# Patient Record
Sex: Male | Born: 1980 | Race: White | Hispanic: No | Marital: Married | Smoking: Current some day smoker
Health system: Southern US, Community
[De-identification: ages and names within clinical notes are randomized; demographics above are authoritative.]

## PROBLEM LIST (undated history)

## (undated) DIAGNOSIS — M1990 Unspecified osteoarthritis, unspecified site: Secondary | ICD-10-CM

## (undated) HISTORY — PX: ROTATOR CUFF REPAIR: SHX139

## (undated) HISTORY — PX: WRIST ARTHROSCOPY: SHX838

---

## 2002-06-21 ENCOUNTER — Encounter: Payer: Self-pay | Admitting: Family Medicine

## 2002-06-21 ENCOUNTER — Ambulatory Visit (HOSPITAL_COMMUNITY): Admission: RE | Admit: 2002-06-21 | Discharge: 2002-06-21 | Payer: Self-pay | Admitting: Family Medicine

## 2003-07-18 ENCOUNTER — Ambulatory Visit (HOSPITAL_COMMUNITY): Admission: RE | Admit: 2003-07-18 | Discharge: 2003-07-19 | Payer: Self-pay | Admitting: Orthopedic Surgery

## 2003-10-07 ENCOUNTER — Ambulatory Visit (HOSPITAL_COMMUNITY): Admission: RE | Admit: 2003-10-07 | Discharge: 2003-10-07 | Payer: Self-pay | Admitting: Orthopedic Surgery

## 2005-11-22 IMAGING — CT CT EXTREM UP W/O CM*L*
3 series · 16 of 34 positions shown, 19 images · non-contrast
Comparison: none

CLINICAL DATA: Follow-up scaphoid fracture; status post bone graft; recent removal of surgical pins
CT OF THE LEFT WRIST WITHOUT CONTRAST
Multidetector CT imaging of the left wrist was performed in the direct axial plane.  Sagittal and coronal plane reconstructed images were reformatted from the axial CT data set and were also reviewed.  
Fracture deformity of the scaphoid bone is seen which appears old.  There is no evidence of acute fracture on this study and there is no evidence of significant osteosclerosis or abnormal bone lucency which would be worrisome for avascular necrosis.  There is no evidence of significant degenerative changes.  Alignment to the bones is normal. 
No other fractures or bone lesions are seen. 
IMPRESSION
Fracture deformity of the scaphoid bone, which appears well healed.  No evidence of acute fracture or findings suggestive of avascular necrosis.  
CT MULTIPLANAR RECONSTRUCTION OF THE LEFT WRIST
Multiplanar reformatted CT images were reconstructed from the axial CT data set. These images were reviewed and pertinent findings are included in the accompanying complete CT report. 

See complete CT report above.

[Series 3: recon 2: upper extremity · axial · 0.31mm/px · z∈[+87,+209]mm · 8 of 59 slices shown, 10 images]
[im 5/59  soft-tissue]
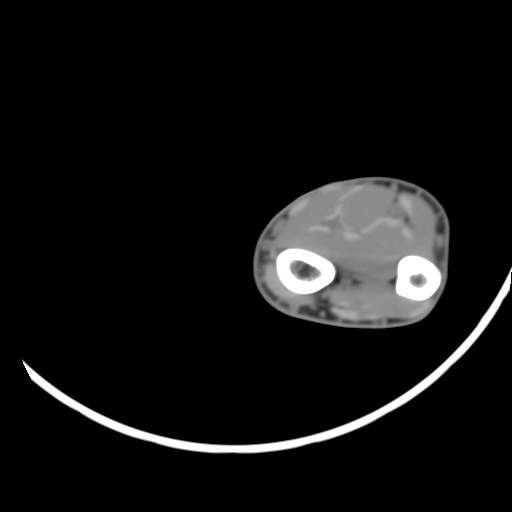
[im 5/59  bone]
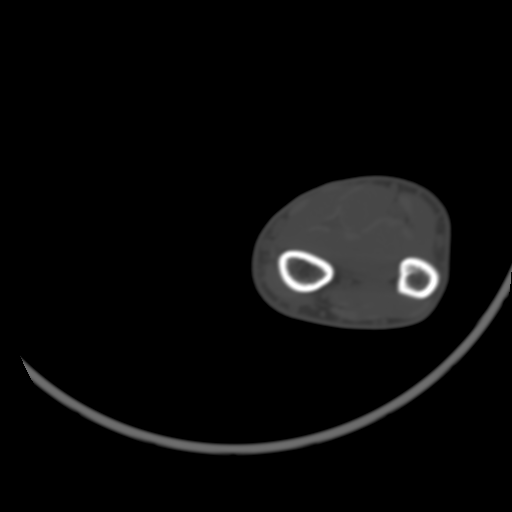
[im 14/59  bone]
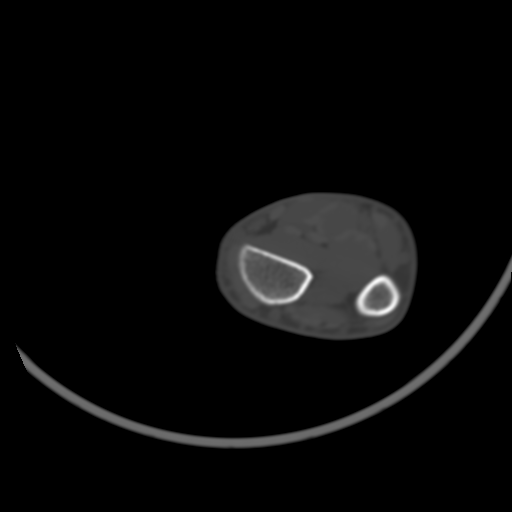
[im 18/59  bone]
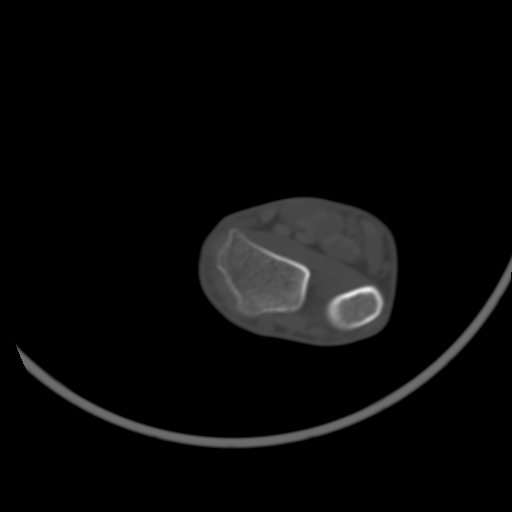
[im 27/59  bone]
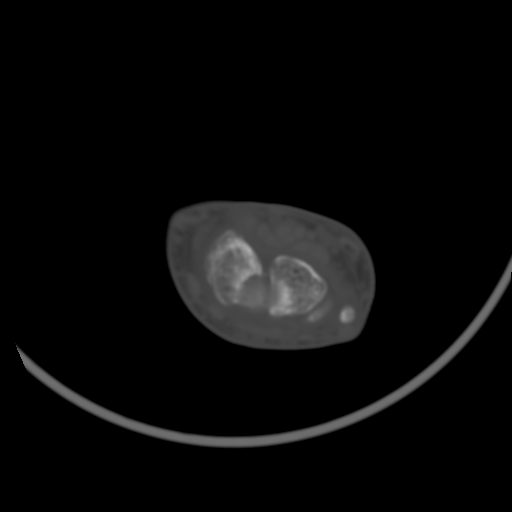
[im 32/59  soft-tissue]
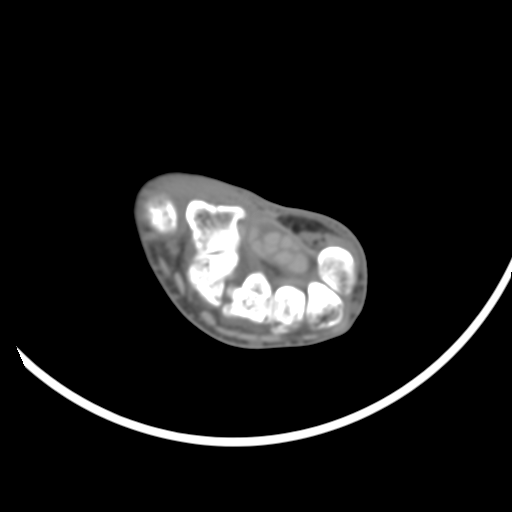
[im 32/59  bone]
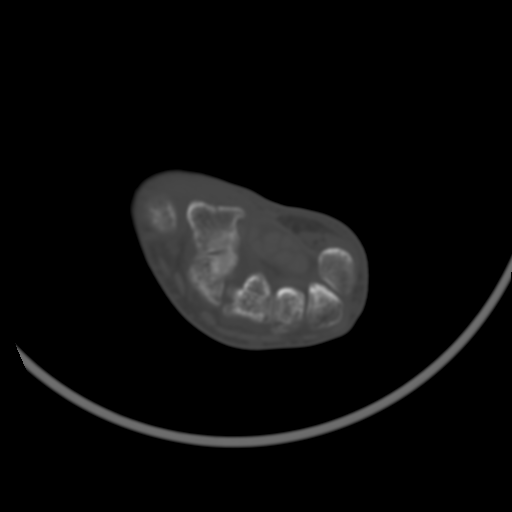
[im 41/59  bone]
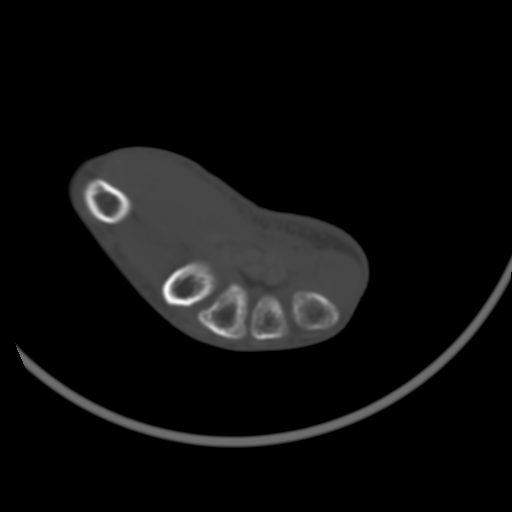
[im 45/59  bone]
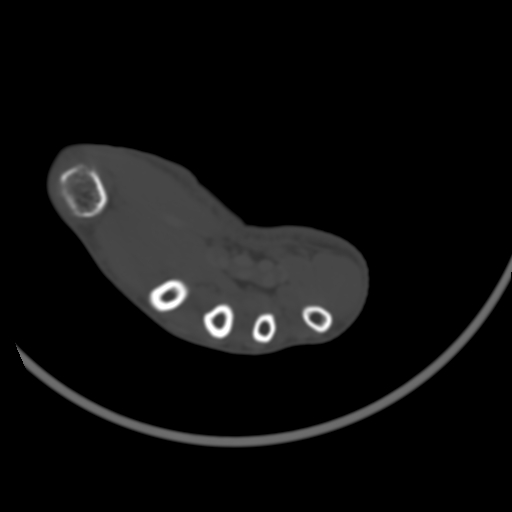
[im 54/59  bone]
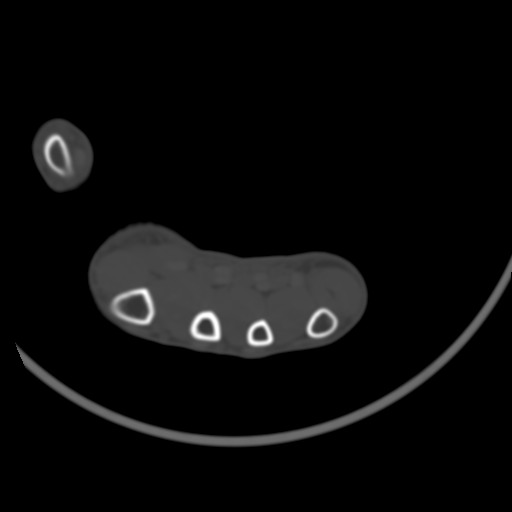

[Series 400: reformatted · sagittal · 0.31mm/px · 5 of 33 slices shown, 6 images (1 of 2)]
[im 11/33  bone]
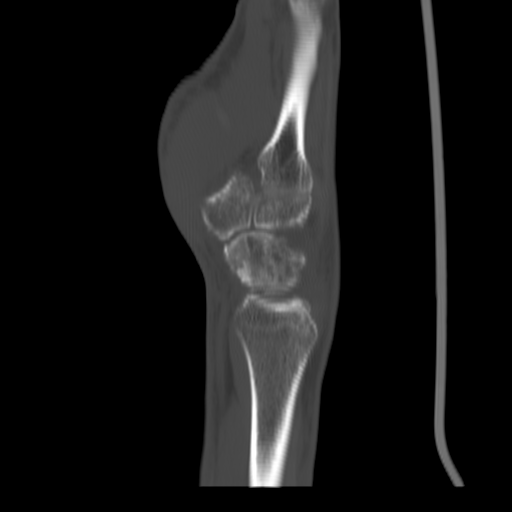
[im 14/33  bone]
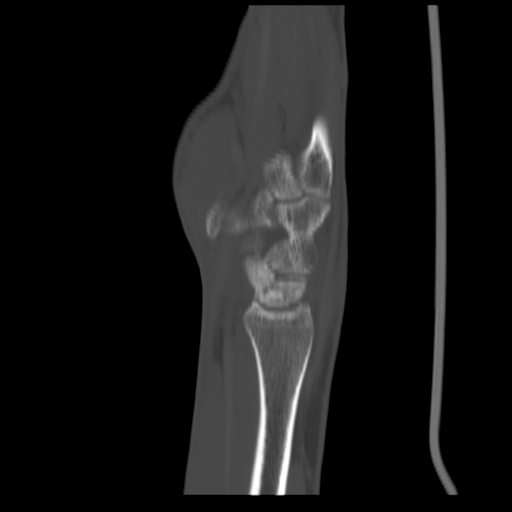
[im 17/33  soft-tissue]
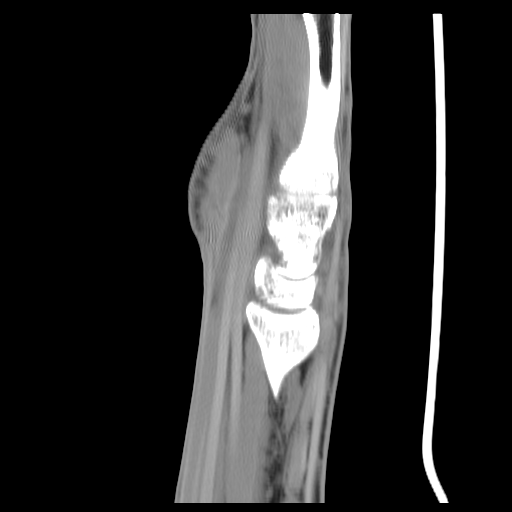
[im 17/33  bone]
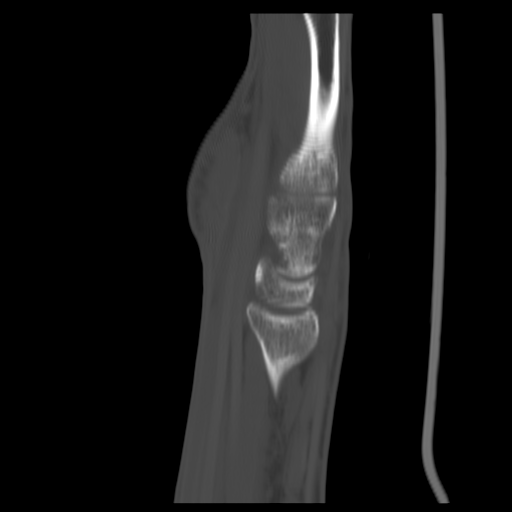
[im 19/33  bone]
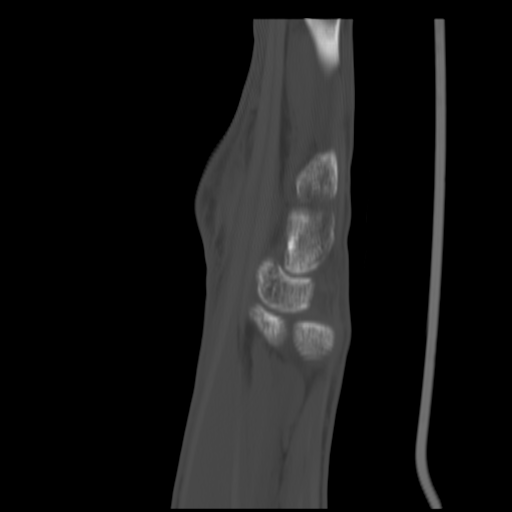
[im 22/33  bone]
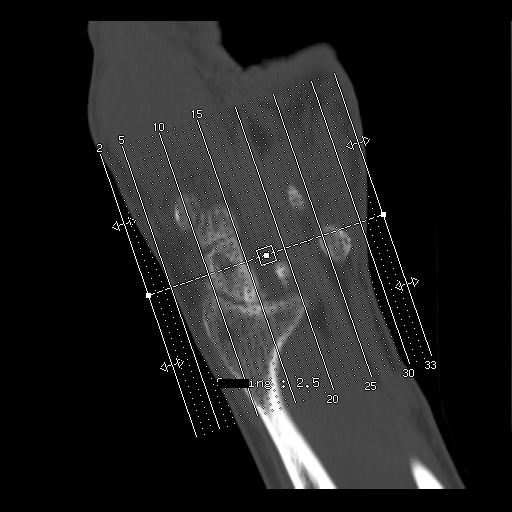

[Series 401: reformatted · coronal · 0.31mm/px · 3 of 41 slices shown (2 of 2)]
[im 9/41  bone]
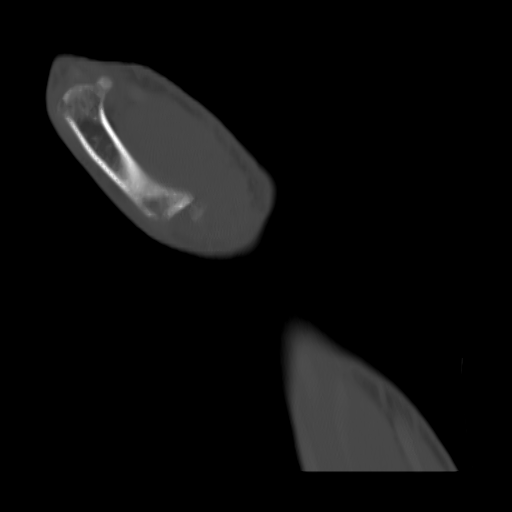
[im 17/41  bone]
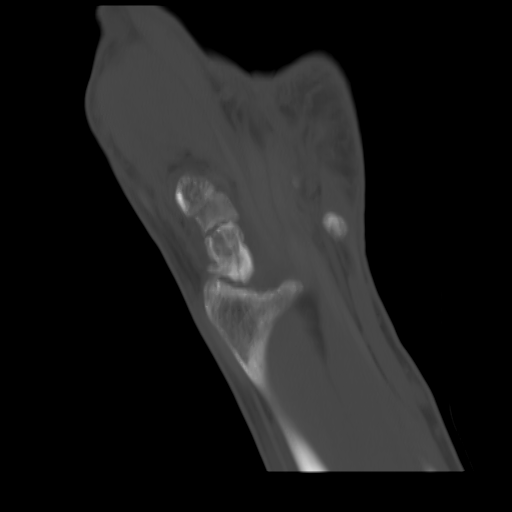
[im 25/41  bone]
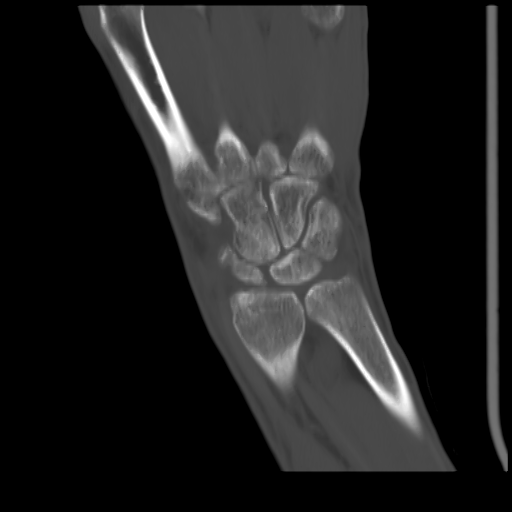

[16 of 34 positions shown; findings below may reference images not displayed]

## 2010-03-01 ENCOUNTER — Encounter: Payer: Self-pay | Admitting: Orthopedic Surgery

## 2018-06-12 ENCOUNTER — Encounter (HOSPITAL_BASED_OUTPATIENT_CLINIC_OR_DEPARTMENT_OTHER): Payer: Self-pay | Admitting: *Deleted

## 2018-06-12 ENCOUNTER — Other Ambulatory Visit: Payer: Self-pay

## 2018-06-12 NOTE — Progress Notes (Signed)
Ensure pre surgery drink given with instructions to complete by 0400 dos, pt verbalized understanding. 

## 2018-06-14 NOTE — Pre-Procedure Instructions (Signed)
Call attempted to re screen pt for Covid-19 virus. LVM with call back number.

## 2018-06-15 ENCOUNTER — Ambulatory Visit (HOSPITAL_BASED_OUTPATIENT_CLINIC_OR_DEPARTMENT_OTHER): Payer: 59 | Admitting: Anesthesiology

## 2018-06-15 ENCOUNTER — Other Ambulatory Visit: Payer: Self-pay

## 2018-06-15 ENCOUNTER — Encounter (HOSPITAL_BASED_OUTPATIENT_CLINIC_OR_DEPARTMENT_OTHER)
Admission: RE | Disposition: A | Discharge status: Home / Self Care | Payer: Self-pay | Source: Home / Self Care | Attending: Orthopaedic Surgery

## 2018-06-15 ENCOUNTER — Encounter (HOSPITAL_BASED_OUTPATIENT_CLINIC_OR_DEPARTMENT_OTHER): Payer: Self-pay | Admitting: *Deleted

## 2018-06-15 ENCOUNTER — Ambulatory Visit (HOSPITAL_BASED_OUTPATIENT_CLINIC_OR_DEPARTMENT_OTHER)
Admission: RE | Admit: 2018-06-15 | Discharge: 2018-06-15 | Disposition: A | Discharge status: Home / Self Care | Payer: 59 | Attending: Orthopaedic Surgery | Admitting: Orthopaedic Surgery

## 2018-06-15 DIAGNOSIS — Y9302 Activity, running: Secondary | ICD-10-CM | POA: Diagnosis not present

## 2018-06-15 DIAGNOSIS — Z91040 Latex allergy status: Secondary | ICD-10-CM | POA: Insufficient documentation

## 2018-06-15 DIAGNOSIS — W19XXXA Unspecified fall, initial encounter: Secondary | ICD-10-CM | POA: Insufficient documentation

## 2018-06-15 DIAGNOSIS — F17290 Nicotine dependence, other tobacco product, uncomplicated: Secondary | ICD-10-CM | POA: Diagnosis not present

## 2018-06-15 DIAGNOSIS — M1990 Unspecified osteoarthritis, unspecified site: Secondary | ICD-10-CM | POA: Insufficient documentation

## 2018-06-15 DIAGNOSIS — S63642A Sprain of metacarpophalangeal joint of left thumb, initial encounter: Secondary | ICD-10-CM | POA: Insufficient documentation

## 2018-06-15 HISTORY — DX: Unspecified osteoarthritis, unspecified site: M19.90

## 2018-06-15 HISTORY — PX: ULNAR COLLATERAL LIGAMENT REPAIR: SHX6159

## 2018-06-15 SURGERY — REPAIR, LIGAMENT, ULNAR COLLATERAL
Anesthesia: General | Site: Thumb | Laterality: Left

## 2018-06-15 MED ORDER — OXYCODONE HCL 5 MG PO TABS: 5.0000 mg | ORAL_TABLET | Freq: Once | ORAL | Status: DC | PRN

## 2018-06-15 MED ORDER — BUPIVACAINE-EPINEPHRINE (PF) 0.5% -1:200000 IJ SOLN
INTRAMUSCULAR | Status: AC
  Filled 2018-06-15: qty 30

## 2018-06-15 MED ORDER — BUPIVACAINE-EPINEPHRINE (PF) 0.25% -1:200000 IJ SOLN
INTRAMUSCULAR | Status: AC
  Filled 2018-06-15: qty 30

## 2018-06-15 MED ORDER — FENTANYL CITRATE (PF) 100 MCG/2ML IJ SOLN
50.0000 ug | INTRAMUSCULAR | Status: DC | PRN
  Administered 2018-06-15: 100 ug via INTRAVENOUS

## 2018-06-15 MED ORDER — DEXAMETHASONE SODIUM PHOSPHATE 10 MG/ML IJ SOLN
INTRAMUSCULAR | Status: AC
  Filled 2018-06-15: qty 1

## 2018-06-15 MED ORDER — BUPIVACAINE HCL (PF) 0.5 % IJ SOLN
INTRAMUSCULAR | Status: AC
  Filled 2018-06-15: qty 30

## 2018-06-15 MED ORDER — PROPOFOL 10 MG/ML IV BOLUS
INTRAVENOUS | Status: AC
  Filled 2018-06-15: qty 20

## 2018-06-15 MED ORDER — PROPOFOL 500 MG/50ML IV EMUL
INTRAVENOUS | Status: AC
  Filled 2018-06-15: qty 50

## 2018-06-15 MED ORDER — CEFAZOLIN SODIUM-DEXTROSE 2-4 GM/100ML-% IV SOLN
INTRAVENOUS | Status: AC
  Filled 2018-06-15: qty 100

## 2018-06-15 MED ORDER — BUPIVACAINE HCL (PF) 0.25 % IJ SOLN
INTRAMUSCULAR | Status: AC
  Filled 2018-06-15: qty 30

## 2018-06-15 MED ORDER — MIDAZOLAM HCL 2 MG/2ML IJ SOLN
INTRAMUSCULAR | Status: AC
  Filled 2018-06-15: qty 2

## 2018-06-15 MED ORDER — LIDOCAINE HCL (CARDIAC) PF 100 MG/5ML IV SOSY
PREFILLED_SYRINGE | INTRAVENOUS | Status: DC | PRN
  Administered 2018-06-15: 50 mg via INTRAVENOUS

## 2018-06-15 MED ORDER — PROPOFOL 10 MG/ML IV BOLUS
INTRAVENOUS | Status: DC | PRN
  Administered 2018-06-15: 20 mg via INTRAVENOUS

## 2018-06-15 MED ORDER — SCOPOLAMINE 1 MG/3DAYS TD PT72: 1.0000 | MEDICATED_PATCH | Freq: Once | TRANSDERMAL | Status: DC | PRN

## 2018-06-15 MED ORDER — ROPIVACAINE HCL 5 MG/ML IJ SOLN
INTRAMUSCULAR | Status: DC | PRN
  Administered 2018-06-15: 30 mL via PERINEURAL

## 2018-06-15 MED ORDER — CHLORHEXIDINE GLUCONATE 4 % EX LIQD: 60.0000 mL | Freq: Once | CUTANEOUS | Status: DC

## 2018-06-15 MED ORDER — LACTATED RINGERS IV SOLN: INTRAVENOUS | Status: DC

## 2018-06-15 MED ORDER — BACITRACIN ZINC 500 UNIT/GM EX OINT
TOPICAL_OINTMENT | CUTANEOUS | Status: AC
  Filled 2018-06-15: qty 0.9

## 2018-06-15 MED ORDER — FENTANYL CITRATE (PF) 100 MCG/2ML IJ SOLN
INTRAMUSCULAR | Status: AC
  Filled 2018-06-15: qty 2

## 2018-06-15 MED ORDER — MIDAZOLAM HCL 2 MG/2ML IJ SOLN
1.0000 mg | INTRAMUSCULAR | Status: DC | PRN
  Administered 2018-06-15: 2 mg via INTRAVENOUS

## 2018-06-15 MED ORDER — MEPERIDINE HCL 25 MG/ML IJ SOLN: 6.2500 mg | INTRAMUSCULAR | Status: DC | PRN

## 2018-06-15 MED ORDER — HYDROCODONE-ACETAMINOPHEN 5-325 MG PO TABS: 1.0000 | ORAL_TABLET | Freq: Four times a day (QID) | ORAL | 0 refills | Status: AC | PRN

## 2018-06-15 MED ORDER — LIDOCAINE 2% (20 MG/ML) 5 ML SYRINGE
INTRAMUSCULAR | Status: AC
  Filled 2018-06-15: qty 5

## 2018-06-15 MED ORDER — PROPOFOL 500 MG/50ML IV EMUL
INTRAVENOUS | Status: DC | PRN
  Administered 2018-06-15: 100 ug/kg/min via INTRAVENOUS

## 2018-06-15 MED ORDER — OXYCODONE HCL 5 MG/5ML PO SOLN: 5.0000 mg | Freq: Once | ORAL | Status: DC | PRN

## 2018-06-15 MED ORDER — PROMETHAZINE HCL 25 MG/ML IJ SOLN: 6.2500 mg | INTRAMUSCULAR | Status: DC | PRN

## 2018-06-15 MED ORDER — CEFAZOLIN SODIUM-DEXTROSE 2-4 GM/100ML-% IV SOLN
2.0000 g | INTRAVENOUS | Status: AC
  Administered 2018-06-15: 07:00:00 2 g via INTRAVENOUS

## 2018-06-15 MED ORDER — ONDANSETRON HCL 4 MG/2ML IJ SOLN
INTRAMUSCULAR | Status: AC
  Filled 2018-06-15: qty 2

## 2018-06-15 MED ORDER — ONDANSETRON HCL 4 MG/2ML IJ SOLN
INTRAMUSCULAR | Status: DC | PRN
  Administered 2018-06-15: 4 mg via INTRAVENOUS

## 2018-06-15 MED ORDER — HYDROMORPHONE HCL 1 MG/ML IJ SOLN: 0.2500 mg | INTRAMUSCULAR | Status: DC | PRN

## 2018-06-15 SURGICAL SUPPLY — 72 items
ANCHOR REPAIR HAND WRIST (Orthopedic Implant) ×2 IMPLANT
BANDAGE ACE 3X5.8 VEL STRL LF (GAUZE/BANDAGES/DRESSINGS) IMPLANT
BLADE CLIPPER SURG (BLADE) IMPLANT
BLADE SURG 15 STRL LF DISP TIS (BLADE) ×2 IMPLANT
BLADE SURG 15 STRL SS (BLADE) ×9
BNDG CMPR 9X4 STRL LF SNTH (GAUZE/BANDAGES/DRESSINGS) ×1
BNDG CONFORM 3 STRL LF (GAUZE/BANDAGES/DRESSINGS) ×3 IMPLANT
BNDG ESMARK 4X9 LF (GAUZE/BANDAGES/DRESSINGS) ×2 IMPLANT
BNDG GAUZE ELAST 4 BULKY (GAUZE/BANDAGES/DRESSINGS) ×5 IMPLANT
BRUSH SCRUB EZ PLAIN DRY (MISCELLANEOUS) ×3 IMPLANT
CANISTER SUCT 1200ML W/VALVE (MISCELLANEOUS) IMPLANT
CORD BIPOLAR FORCEPS 12FT (ELECTRODE) ×3 IMPLANT
COVER BACK TABLE REUSABLE LG (DRAPES) ×3 IMPLANT
COVER WAND RF STERILE (DRAPES) IMPLANT
CUFF TOURN SGL QUICK 18X4 (TOURNIQUET CUFF) ×2 IMPLANT
DECANTER SPIKE VIAL GLASS SM (MISCELLANEOUS) IMPLANT
DRAIN TLS ROUND 10FR (DRAIN) IMPLANT
DRAPE EXTREMITY T 121X128X90 (DISPOSABLE) ×3 IMPLANT
DRAPE OEC MINIVIEW 54X84 (DRAPES) ×2 IMPLANT
DRAPE SURG 17X23 STRL (DRAPES) ×3 IMPLANT
DRSG EMULSION OIL 3X3 NADH (GAUZE/BANDAGES/DRESSINGS) ×3 IMPLANT
GAUZE 4X4 16PLY RFD (DISPOSABLE) IMPLANT
GAUZE SPONGE 4X4 12PLY STRL (GAUZE/BANDAGES/DRESSINGS) ×3 IMPLANT
GLOVE BIOGEL PI IND STRL 8 (GLOVE) IMPLANT
GLOVE BIOGEL PI INDICATOR 8 (GLOVE)
GLOVE SURG SYN 7.5  E (GLOVE) ×2
GLOVE SURG SYN 7.5 E (GLOVE) ×1 IMPLANT
GLOVE SURG SYN 7.5 PF PI (GLOVE) ×1 IMPLANT
GOWN STRL REUS W/ TWL LRG LVL3 (GOWN DISPOSABLE) ×2 IMPLANT
GOWN STRL REUS W/TWL LRG LVL3 (GOWN DISPOSABLE) ×6
GUIDEWIRE THREADED 150MM (WIRE) IMPLANT
NDL HYPO 25X1 1.5 SAFETY (NEEDLE) IMPLANT
NEEDLE HYPO 22GX1.5 SAFETY (NEEDLE) ×2 IMPLANT
NEEDLE HYPO 25X1 1.5 SAFETY (NEEDLE) IMPLANT
NS IRRIG 1000ML POUR BTL (IV SOLUTION) ×3 IMPLANT
PACK BASIN DAY SURGERY FS (CUSTOM PROCEDURE TRAY) ×3 IMPLANT
PAD CAST 3X4 CTTN HI CHSV (CAST SUPPLIES) IMPLANT
PADDING CAST ABS 3INX4YD NS (CAST SUPPLIES)
PADDING CAST ABS 4INX4YD NS (CAST SUPPLIES)
PADDING CAST ABS COTTON 3X4 (CAST SUPPLIES) IMPLANT
PADDING CAST ABS COTTON 4X4 ST (CAST SUPPLIES) IMPLANT
PADDING CAST COTTON 3X4 STRL (CAST SUPPLIES)
PADDING CAST SYNTHETIC 2 (CAST SUPPLIES)
PADDING CAST SYNTHETIC 2X4 NS (CAST SUPPLIES) IMPLANT
PASSER SUT SWANSON 36MM LOOP (INSTRUMENTS) IMPLANT
SCOTCHCAST PLUS 3X4 WHITE (CAST SUPPLIES) IMPLANT
SHEET MEDIUM DRAPE 40X70 STRL (DRAPES) ×3 IMPLANT
SLING ARM FOAM STRAP LRG (SOFTGOODS) ×2 IMPLANT
SPLINT PLASTER CAST XFAST 3X15 (CAST SUPPLIES) IMPLANT
SPLINT PLASTER XTRA FASTSET 3X (CAST SUPPLIES) ×20
SPONGE SURGIFOAM ABS GEL 12-7 (HEMOSTASIS) IMPLANT
STOCKINETTE 4X48 STRL (DRAPES) ×3 IMPLANT
STOCKINETTE SYNTHETIC 3 UNSTER (CAST SUPPLIES) ×3 IMPLANT
SUCTION FRAZIER HANDLE 10FR (MISCELLANEOUS) ×2
SUCTION TUBE FRAZIER 10FR DISP (MISCELLANEOUS) IMPLANT
SUT BONE WAX W31G (SUTURE) IMPLANT
SUT FIBERWIRE 3-0 18 TAPR NDL (SUTURE)
SUT FIBERWIRE 4-0 18 TAPR NDL (SUTURE)
SUT PROLENE 4 0 PS 2 18 (SUTURE) ×2 IMPLANT
SUT VIC AB 4-0 P-3 18XBRD (SUTURE) IMPLANT
SUT VIC AB 4-0 P3 18 (SUTURE) ×3
SUTURE FIBERWR 3-0 18 TAPR NDL (SUTURE) IMPLANT
SUTURE FIBERWR 4-0 18 TAPR NDL (SUTURE) IMPLANT
SYR BULB 3OZ (MISCELLANEOUS) ×3 IMPLANT
SYR CONTROL 10ML LL (SYRINGE) ×2 IMPLANT
TAPE SURG TRANSPORE 1 IN (GAUZE/BANDAGES/DRESSINGS) ×1 IMPLANT
TAPE SURGICAL TRANSPORE 1 IN (GAUZE/BANDAGES/DRESSINGS) ×2
TOWEL GREEN STERILE FF (TOWEL DISPOSABLE) ×6 IMPLANT
TRAY DSU PREP LF (CUSTOM PROCEDURE TRAY) ×2 IMPLANT
TUBE CONNECTING 20'X1/4 (TUBING) ×1
TUBE CONNECTING 20X1/4 (TUBING) ×1 IMPLANT
UNDERPAD 30X30 (UNDERPADS AND DIAPERS) ×3 IMPLANT

## 2018-06-15 NOTE — H&P (Signed)
  Kevin Rivera is an 38 y.o. male.  HPI: Patient is a 38 year old male who was seen and evaluated by me in clinic.  He was found to have a left thumb ulnar collateral ligament tear following an injury and fall while running.  He dislocated the thumb MP joint and subsequent MRI showed complete, retracted tear of the ulnar collateral ligament with interposition of the adductor aponeurosis.  We discussed treatment options and he did elect proceed with operative intervention and presents today for that.  Past Medical History:  Diagnosis Date  . Arthritis     Past Surgical History:  Procedure Laterality Date  . ROTATOR CUFF REPAIR     x2  . WRIST ARTHROSCOPY     x2    History reviewed. No pertinent family history.  Social History:  reports that he has been smoking pipe. He has never used smokeless tobacco. He reports current alcohol use. He reports that he does not use drugs.  Allergies:  Allergies  Allergen Reactions  . Latex Rash    Medications: I have reviewed the patient's current medications.  No results found for this or any previous visit (from the past 48 hour(s)).  No results found.  Review of Systems  Constitutional: Negative for chills and fever.  Musculoskeletal: Positive for joint pain.  All other systems reviewed and are negative.  Blood pressure 128/80, pulse 60, temperature 98.1 F (36.7 C), temperature source Oral, height 6\' 1"  (1.854 m), weight 86.3 kg, SpO2 100 %.  Constitutional: General Appearance: healthy-appearing, NAD, and normal body habitus.  Psychiatric: Orientation: oriented to time, place, and person. Mood and Affect: normal mood and affect and active and alert.  Cardiovascular System: Arterial Pulses Left: radial pulse 2+ and capillary refill test normal. Edema Left: none.  Lungs: Lungs respirations unlabored.  Skin: Left Upper Extremity: normal.  Neurological System: Sensation on the Left: normal ulnar nerve distribution, radial nerve  distribution, and median nerve distribution.  Examination of the left thumb shows no significant swelling. He has tenderness along the ulnar aspect of the joint in the area of the ulnar collateral ligament. He has intact flexion and extension to both the thumb IP and MP joints although it is somewhat limited by pain. He has significant instability when testing the ulnar collateral ligament which opens greater than 30 from the contralateral side with no significant endpoint. There is no tenderness along the radial aspect of the digit and the radial collateral ligament is stable. The tip of the thumb is warm and well perfused with brisk capillary refill. His sensation is intact both radial and ulnar aspects of the digit.  Assessment/Plan: Left thumb ulnar collateral ligament tear  We will proceed to the OR today for repair of the left thumb MP joint ulnar collateral ligament.  The risks and benefits of the surgery were discussed with him at length which include but are not limited to infection, bleeding, damage to surrounding structures include blood vessels and nerves, pain, stiffness, re-tear, need for additional procedures.  Informed consent was obtained and the patient's left thumb was marked with surgical marking pen.  Plan for discharge home postoperatively with follow-up with me in approximately 5 to 7 days.  Cain Saupe III 06/15/2018, 7:07 AM

## 2018-06-15 NOTE — Anesthesia Postprocedure Evaluation (Signed)
Anesthesia Post Note  Patient: Kevin Rivera  Procedure(s) Performed: Left thumb ulnar collateral ligament repair and surgery as indicated (Left Thumb)     Patient location during evaluation: PACU Anesthesia Type: MAC Level of consciousness: awake and alert Pain management: pain level controlled Vital Signs Assessment: post-procedure vital signs reviewed and stable Respiratory status: spontaneous breathing, nonlabored ventilation and respiratory function stable Cardiovascular status: blood pressure returned to baseline and stable Postop Assessment: no apparent nausea or vomiting Anesthetic complications: no    Last Vitals:  Vitals:   06/15/18 0900 06/15/18 0945  BP: 119/82 111/76  Pulse: (!) 53 (!) 51  Resp: 16 16  Temp:  36.6 C  SpO2: 97% 97%    Last Pain:  Vitals:   06/15/18 0945  TempSrc:   PainSc: 0-No pain                 Lynda Rainwater

## 2018-06-15 NOTE — Discharge Instructions (Signed)
Discharge Instructions ° °- Keep dressings in place. Do not remove them. °- The dressings must stay dry °- Take all medication as prescribed. Transition to over the counter pain medication as your pain improves °- Keep the hand elevated over the next 48-72 hours to help with pain and swelling °- Move all digits not restricted by the dressings regularly to prevent stiffness °- Please call to schedule a follow up appointment with Dr. Creighton and therapy at [phone] for 5-7 days following surgery °- Your pain medication have been send digitally to your pharmacy ° ° ° °Regional Anesthesia Blocks ° °1. Numbness or the inability to move the "blocked" extremity may last from 3-48 hours after placement. The length of time depends on the medication injected and your individual response to the medication. If the numbness is not going away after 48 hours, call your surgeon. ° °2. The extremity that is blocked will need to be protected until the numbness is gone and the  Strength has returned. Because you cannot feel it, you will need to take extra care to avoid injury. Because it may be weak, you may have difficulty moving it or using it. You may not know what position it is in without looking at it while the block is in effect. ° °3. For blocks in the legs and feet, returning to weight bearing and walking needs to be done carefully. You will need to wait until the numbness is entirely gone and the strength has returned. You should be able to move your leg and foot normally before you try and bear weight or walk. You will need someone to be with you when you first try to ensure you do not fall and possibly risk injury. ° °4. Bruising and tenderness at the needle site are common side effects and will resolve in a few days. ° °5. Persistent numbness or new problems with movement should be communicated to the surgeon or the [facility] ([phone])/ [facility] ([phone]). ° ° ° ° ° ° °Post  Anesthesia Home Care Instructions ° °Activity: °Get plenty of rest for the remainder of the day. A responsible individual must stay with you for 24 hours following the procedure.  °For the next 24 hours, DO NOT: °-Drive a car °-Operate machinery °-Drink alcoholic beverages °-Take any medication unless instructed by your physician °-Make any legal decisions or sign important papers. ° °Meals: °Start with liquid foods such as gelatin or soup. Progress to regular foods as tolerated. Avoid greasy, spicy, heavy foods. If nausea and/or vomiting occur, drink only clear liquids until the nausea and/or vomiting subsides. Call your physician if vomiting continues. ° °Special Instructions/Symptoms: °Your throat may feel dry or sore from the anesthesia or the breathing tube placed in your throat during surgery. If this causes discomfort, gargle with warm salt water. The discomfort should disappear within 24 hours. ° °If you had a scopolamine patch placed behind your ear for the management of post- operative nausea and/or vomiting: ° °1. The medication in the patch is effective for 72 hours, after which it should be removed.  Wrap patch in a tissue and discard in the trash. Wash hands thoroughly with soap and water. °2. You may remove the patch earlier than 72 hours if you experience unpleasant side effects which may include dry mouth, dizziness or visual disturbances. °3. Avoid touching the patch. Wash your hands with soap and water after contact with the patch. °   ° °

## 2018-06-15 NOTE — Anesthesia Procedure Notes (Signed)
Anesthesia Regional Block: Supraclavicular block   Pre-Anesthetic Checklist: ,, timeout performed, Correct Patient, Correct Site, Correct Laterality, Correct Procedure, Correct Position, site marked, Risks and benefits discussed,  Surgical consent,  Pre-op evaluation,  At surgeon's request and post-op pain management  Laterality: Left  Prep: chloraprep       Needles:  Injection technique: Single-shot  Needle Type: Stimiplex     Needle Length: 9cm  Needle Gauge: 21     Additional Needles:   Procedures:,,,, ultrasound used (permanent image in chart),,,,  Narrative:  Start time: 06/15/2018 7:19 AM End time: 06/15/2018 7:24 AM Injection made incrementally with aspirations every 5 mL.  Performed by: Personally  Anesthesiologist: Lowella Curb, MD

## 2018-06-15 NOTE — Op Note (Signed)
PREOPERATIVE DIAGNOSIS: Left thumb metacarpal phalangeal joint ulnar collateral ligament tear with Stener lesion  POSTOPERATIVE DIAGNOSIS: Same  ATTENDING PHYSICIAN: Maudry Mayhew. Jeannie Fend, III, MD who was present and scrubbed for the entire case   ASSISTANT SURGEON: None.   ANESTHESIA: Regional with MAC  SURGICAL PROCEDURES: Left thumb ulnar collateral ligament repair at the MP joint  SURGICAL INDICATIONS: Patient is a 38 year old male who sustained a fall while running approximately 3 weeks ago.  He states his thumb dislocated at the MP joint.  He closed reduced himself and he was subsequently seen in clinic.  He was found to have significant instability of the thumb MP joint and an MRI was obtained which showed a full-thickness retracted tear of the ulnar collateral ligament at the MP joint.  After discussing treatment options he did agree to proceed with surgical intervention and presents today for that.  FINDING: Examination under anesthesia showed significant instability when testing the ulnar collateral ligament of the thumb MP joint with greater than 40 degrees of opening through the joint.  Full-thickness, retracted tear of the thumb MP ulnar collateral ligament.  There is interposition of the thumb adductor aponeurosis consistent with a stener lesion.  After anatomic repair and stabilization with internal brace there was significant improvement and stability through the MP joint.  DESCRIPTION OF PROCEDURE: Patient was identified in the preoperative holding area where the risk benefits and alternatives of the procedure were discussed with the patient.  These include but are not limited to infection, bleeding, damage to surrounding structures including blood vessels and nerves, pain, stiffness, re-tear and need for additional procedures.  Informed consent was obtained at that time patient's left thumb was marked with surgical marking pen.  Left upper extremity block was then performed by  anesthesia and then he was brought to the operative suite where a timeout was performed identifying the correct patient operative site.  He was positioned supine on the operative table with his hand outstretched on a hand table.  He was induced under MAC sedation a tourniquet was placed on the upper arm.  The arm was then prepped and draped in usual sterile fashion.  The limb was exsanguinated and the tourniquet was inflated.  A longitudinal incision was made over the ulnar aspect of the thumb IP joint.  Blunt dissection was performed and carried down through the subcutaneous tissue.  There was a sensory nerve which was mobilized and retracted protecting it throughout the remainder of the procedure.  Upon dissecting through the subcutaneous tissue the torn end of the ulnar collateral ligament was immediately visualized.  This had began to scar into the surrounding soft tissues.  Blunt dissection was performed both dorsally and volarly freeing up the local adhesions.  The adductor aponeurosis was interposed between the proximal phalanx and the distal end of the ligament.  The aponeurosis  was incised longitudinally just volar to the extensor tendon.  Flaps were elevated both dorsally and volarly exposing the proximal phalanx deep to it.  The ligament had avulsed off of the proximal phalanx and its footprint was exposed easily.  At this point we opened and readied the Arthrex ulnar collateral ligament repair set with internal brace.  2 K wires were placed parallel to the joint one in the proximal phalanx and one in the metacarpal head.  These were confirmed to be in appropriate position under fluoroscopic imaging.  The provided drill and drill sleeve were then utilized for the swivel lock anchors drilling holes in both the proximal  phalanx and metacarpal head over the K wires.  2 horizontal mattress sutures were placed into the ligament with 2-0 FiberWire.  These were then passed into the swivel lock anchor  horseshoe along with the fiber tape and the distal anchor within the proximal phalanx was inserted in standard fashion.  The 2-0 FiberWire sutures were tensioned prior to full seating of the anchor which reapproximated the ulnar collateral ligament down to its footprint.  The tails of the FiberWire suture were then passed once again through the ulnar collateral ligament and tied in horizontal mattress fashion over top.  The fiber tape sutures were then passed retrograde over top of the ligament forming an internal brace and inserted into a second swivel lock anchor which was inserted into the metacarpal head.  This repair was performed with the MP joint in approximately 30 degrees of flexion.  Following the repair and internal brace the ulnar collateral ligament had excellent stability and the thumb could be passively flexed and extended from full extension to approximately 30 degrees of flexion.  The adductor aponeurosis was then reapproximated and closed with figure-of-eight 2-0 FiberWire sutures.  The wound was then copiously irrigated with normal saline and the skin was closed with interrupted 4-0 Prolene's.  Xeroform, 4 x 4's, Kerlix and a well-padded thumb spica splint were placed in standard fashion.  The tourniquet was released and the fingers had return of brisk capillary refill throughout.  The patient was awoken from his sedation and taken to the PACU in stable condition.  He tolerated the procedure well and there were no complications.  ESTIMATED BLOOD LOSS: Less than 10 mL's  TOURNIQUET TIME: 48 minutes  SPECIMENS: None  POSTOPERATIVE PLAN: The patient will be discharged home and seen back  in the office in approximately 5-7 days for wound check, suture  removal, and then be sent to a therapist for hand-based thumb spica splint, early range of motion protocol, and edema control.  IMPLANTS: Arthrex swivel lock anchors x2

## 2018-06-15 NOTE — Anesthesia Preprocedure Evaluation (Addendum)
Anesthesia Evaluation  Patient identified by MRN, date of birth, ID band Patient awake    Reviewed: Allergy & Precautions, NPO status , Patient's Chart, lab work & pertinent test results  Airway Mallampati: II  TM Distance: >3 FB Neck ROM: Full    Dental no notable dental hx.    Pulmonary neg pulmonary ROS, Current Smoker,    Pulmonary exam normal breath sounds clear to auscultation       Cardiovascular negative cardio ROS Normal cardiovascular exam Rhythm:Regular Rate:Normal     Neuro/Psych negative neurological ROS  negative psych ROS   GI/Hepatic negative GI ROS, Neg liver ROS,   Endo/Other  negative endocrine ROS  Renal/GU negative Renal ROS  negative genitourinary   Musculoskeletal negative musculoskeletal ROS (+)   Abdominal   Peds negative pediatric ROS (+)  Hematology negative hematology ROS (+)   Anesthesia Other Findings   Reproductive/Obstetrics negative OB ROS                             Anesthesia Physical Anesthesia Plan  ASA: II  Anesthesia Plan: General   Post-op Pain Management:    Induction: Intravenous  PONV Risk Score and Plan: 1 and Ondansetron  Airway Management Planned: LMA  Additional Equipment:   Intra-op Plan:   Post-operative Plan: Extubation in OR  Informed Consent: I have reviewed the patients History and Physical, chart, labs and discussed the procedure including the risks, benefits and alternatives for the proposed anesthesia with the patient or authorized representative who has indicated his/her understanding and acceptance.     Dental advisory given  Plan Discussed with: CRNA  Anesthesia Plan Comments:        Anesthesia Quick Evaluation

## 2018-06-15 NOTE — Progress Notes (Signed)
Assisted Dr. Miller with left, ultrasound guided, supraclavicular block. Side rails up, monitors on throughout procedure. See vital signs in flow sheet. Tolerated Procedure well. 

## 2018-06-15 NOTE — Transfer of Care (Signed)
Immediate Anesthesia Transfer of Care Note  Patient: Kevin Rivera  Procedure(s) Performed: Left thumb ulnar collateral ligament repair and surgery as indicated (Left Thumb)  Patient Location: PACU  Anesthesia Type:MAC combined with regional for post-op pain  Level of Consciousness: awake, sedated and patient cooperative  Airway & Oxygen Therapy: Patient Spontanous Breathing and Patient connected to nasal cannula oxygen  Post-op Assessment: Report given to RN and Post -op Vital signs reviewed and stable  Post vital signs: Reviewed and stable  Last Vitals:  Vitals Value Taken Time  BP    Temp    Pulse 55 06/15/2018  8:39 AM  Resp    SpO2 98 % 06/15/2018  8:39 AM  Vitals shown include unvalidated device data.  Last Pain:  Vitals:   06/15/18 9643  TempSrc: Oral  PainSc: 0-No pain         Complications: No apparent anesthesia complications

## 2018-06-16 ENCOUNTER — Encounter (HOSPITAL_BASED_OUTPATIENT_CLINIC_OR_DEPARTMENT_OTHER): Payer: Self-pay | Admitting: Orthopaedic Surgery
